# Patient Record
Sex: Male | Born: 1990 | Race: Black or African American | Hispanic: No | Marital: Single | Smoking: Never smoker
Health system: Southern US, Community
[De-identification: ages and names within clinical notes are randomized; demographics above are authoritative.]

---
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
## 2015-12-28 ENCOUNTER — Emergency Department (HOSPITAL_COMMUNITY)
Admission: EM | Admit: 2015-12-28 | Discharge: 2015-12-28 | Disposition: A | Discharge status: Home / Self Care | Payer: No Typology Code available for payment source | Attending: Emergency Medicine | Admitting: Emergency Medicine

## 2015-12-28 ENCOUNTER — Encounter (HOSPITAL_COMMUNITY): Payer: Self-pay

## 2015-12-28 DIAGNOSIS — Y9289 Other specified places as the place of occurrence of the external cause: Secondary | ICD-10-CM | POA: Diagnosis not present

## 2015-12-28 DIAGNOSIS — Y9389 Activity, other specified: Secondary | ICD-10-CM | POA: Diagnosis not present

## 2015-12-28 DIAGNOSIS — S161XXA Strain of muscle, fascia and tendon at neck level, initial encounter: Secondary | ICD-10-CM | POA: Insufficient documentation

## 2015-12-28 DIAGNOSIS — Y999 Unspecified external cause status: Secondary | ICD-10-CM | POA: Diagnosis not present

## 2015-12-28 DIAGNOSIS — S199XXA Unspecified injury of neck, initial encounter: Secondary | ICD-10-CM | POA: Diagnosis present

## 2015-12-28 MED ORDER — METHOCARBAMOL 500 MG PO TABS: 500.0000 mg | ORAL_TABLET | Freq: Two times a day (BID) | ORAL | Status: DC

## 2015-12-28 MED ORDER — NAPROXEN 500 MG PO TABS: 500.0000 mg | ORAL_TABLET | Freq: Two times a day (BID) | ORAL | Status: DC
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
## 2016-01-10 ENCOUNTER — Encounter (HOSPITAL_COMMUNITY): Payer: Self-pay | Admitting: Emergency Medicine

## 2016-01-10 ENCOUNTER — Emergency Department (INDEPENDENT_AMBULATORY_CARE_PROVIDER_SITE_OTHER): Payer: Self-pay

## 2016-01-10 ENCOUNTER — Emergency Department (INDEPENDENT_AMBULATORY_CARE_PROVIDER_SITE_OTHER)
Admission: EM | Admit: 2016-01-10 | Discharge: 2016-01-10 | Disposition: A | Discharge status: Home / Self Care | Payer: Self-pay | Source: Home / Self Care | Attending: Family Medicine | Admitting: Family Medicine

## 2016-01-10 DIAGNOSIS — M545 Low back pain: Secondary | ICD-10-CM

## 2016-01-10 MED ORDER — CYCLOBENZAPRINE HCL 10 MG PO TABS: 10.0000 mg | ORAL_TABLET | Freq: Two times a day (BID) | ORAL | Status: DC | PRN
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
## 2016-01-10 NOTE — Discharge Instructions (Signed)
Back Pain, Adult °Back pain is very common in adults. The cause of back pain is rarely dangerous and the pain often gets better over time. The cause of your back pain may not be known. Some common causes of back pain include: °1. Strain of the muscles or ligaments supporting the spine. °2. Wear and tear (degeneration) of the spinal disks. °3. Arthritis. °4. Direct injury to the back. °For many people, back pain may return. Since back pain is rarely dangerous, most people can learn to manage this condition on their own. °HOME CARE INSTRUCTIONS °Watch your back pain for any changes. The following actions may help to lessen any discomfort you are feeling: °1. Remain active. It is stressful on your back to sit or stand in one place for long periods of time. Do not sit, drive, or stand in one place for more than 30 minutes at a time. Take short walks on even surfaces as soon as you are able. Try to increase the length of time you walk each day. °2. Exercise regularly as directed by your health care provider. Exercise helps your back heal faster. It also helps avoid future injury by keeping your muscles strong and flexible. °3. Do not stay in bed. Resting more than 1-2 days can delay your recovery. °4. Pay attention to your body when you bend and lift. The most comfortable positions are those that put less stress on your recovering back. Always use proper lifting techniques, including: °1. Bending your knees. °2. Keeping the load close to your body. °3. Avoiding twisting. °5. Find a comfortable position to sleep. Use a firm mattress and lie on your side with your knees slightly bent. If you lie on your back, put a pillow under your knees. °6. Avoid feeling anxious or stressed. Stress increases muscle tension and can worsen back pain. It is important to recognize when you are anxious or stressed and learn ways to manage it, such as with exercise. °7. Take medicines only as directed by your health care provider.  Over-the-counter medicines to reduce pain and inflammation are often the most helpful. Your health care provider may prescribe muscle relaxant drugs. These medicines help dull your pain so you can more quickly return to your normal activities and healthy exercise. °8. Apply ice to the injured area: °1. Put ice in a plastic bag. °2. Place a towel between your skin and the bag. °3. Leave the ice on for 20 minutes, 2-3 times a day for the first 2-3 days. After that, ice and heat may be alternated to reduce pain and spasms. °9. Maintain a healthy weight. Excess weight puts extra stress on your back and makes it difficult to maintain good posture. °SEEK MEDICAL CARE IF: °1. You have pain that is not relieved with rest or medicine. °2. You have increasing pain going down into the legs or buttocks. °3. You have pain that does not improve in one week. °4. You have night pain. °5. You lose weight. °6. You have a fever or chills. °SEEK IMMEDIATE MEDICAL CARE IF:  °1. You develop new bowel or bladder control problems. °2. You have unusual weakness or numbness in your arms or legs. °3. You develop nausea or vomiting. °4. You develop abdominal pain. °5. You feel faint. °  °This information is not intended to replace advice given to you by your health care provider. Make sure you discuss any questions you have with your health care provider. °  °Document Released: 09/30/2005 Document Revised: 10/21/2014 Document Reviewed: 02/01/2014 °Elsevier Interactive Patient Education ©2016 [employer] ° °Back Exercises °If you have pain in your back, do these exercises 2-3 times each day or as told by your doctor. When the pain goes away, do the exercises once each day, but repeat the steps more times for each exercise (do more repetitions). If you do not have pain in your back, do these exercises once each day or as told by your doctor. °EXERCISES °Single Knee to Chest °Do these steps 3-5 times in a row for each leg: °5. Lie on your back  on a firm bed or the floor with your legs stretched out. °6. Bring one knee to your chest. °7. Hold your knee to your chest by grabbing your knee or thigh. °8. Pull on your knee until you feel a gentle stretch in your lower back. °9. Keep doing the stretch for 10-30 seconds. °10. Slowly let go of your leg and straighten it. °Pelvic Tilt °Do these steps 5-10 times in a row: °10. Lie on your back on a firm bed or the floor with your legs stretched out. °11. Bend your knees so they point up to the ceiling. Your feet should be flat on the floor. °12. Tighten your lower belly (abdomen) muscles to press your lower back against the floor. This will make your tailbone point up to the ceiling instead of pointing down to your feet or the floor. °13. Stay in this position for 5-10 seconds while you gently tighten your muscles and breathe evenly. °Cat-Cow °Do these steps until your lower back bends more easily: °7. Get on your hands and knees on a firm surface. Keep your hands under your shoulders, and keep your knees under your hips. You may put padding under your knees. °8. Let your head hang down, and make your tailbone point down to the floor so your lower back is round like the back of a cat. °9. Stay in this position for 5 seconds. °10. Slowly lift your head and make your tailbone point up to the ceiling so your back hangs low (sags) like the back of a cow. °11. Stay in this position for 5 seconds. °Press-Ups °Do these steps 5-10 times in a row: °6. Lie on your belly (face-down) on the floor. °7. Place your hands near your head, about shoulder-width apart. °8. While you keep your back relaxed and keep your hips on the floor, slowly straighten your arms to raise the top half of your body and lift your shoulders. Do not use your back muscles. To make yourself more comfortable, you may change where you place your hands. °9. Stay in this position for 5 seconds. °10. Slowly return to lying flat on the floor. °Bridges °Do these  steps 10 times in a row: °1. Lie on your back on a firm surface. °2. Bend your knees so they point up to the ceiling. Your feet should be flat on the floor. °3. Tighten your butt muscles and lift your butt off of the floor until your waist is almost as high as your knees. If you do not feel the muscles working in your butt and the back of your thighs, slide your feet 1-2 inches farther away from your butt. °4. Stay in this position for 3-5 seconds. °5. Slowly lower your butt to the floor, and let your butt muscles relax. °If this exercise is too easy, try doing it with your arms crossed over your chest. °Belly Crunches °Do these steps 5-10 times in a row: °1. Lie on your back on   a firm bed or the floor with your legs stretched out. °2. Bend your knees so they point up to the ceiling. Your feet should be flat on the floor. °3. Cross your arms over your chest. °4. Tip your chin a little bit toward your chest but do not bend your neck. °5. Tighten your belly muscles and slowly raise your chest just enough to lift your shoulder blades a tiny bit off of the floor. °6. Slowly lower your chest and your head to the floor. °Back Lifts °Do these steps 5-10 times in a row: °1. Lie on your belly (face-down) with your arms at your sides, and rest your forehead on the floor. °2. Tighten the muscles in your legs and your butt. °3. Slowly lift your chest off of the floor while you keep your hips on the floor. Keep the back of your head in line with the curve in your back. Look at the floor while you do this. °4. Stay in this position for 3-5 seconds. °5. Slowly lower your chest and your face to the floor. °GET HELP IF: °· Your back pain gets a lot worse when you do an exercise. °· Your back pain does not lessen 2 hours after you exercise. °If you have any of these problems, stop doing the exercises. Do not do them again unless your doctor says it is okay. °GET HELP RIGHT AWAY IF: °· You have sudden, very bad back pain. If this  happens, stop doing the exercises. Do not do them again unless your doctor says it is okay. °  °This information is not intended to replace advice given to you by your health care provider. Make sure you discuss any questions you have with your health care provider. °  °Document Released: 11/02/2010 Document Revised: 06/21/2015 Document Reviewed: 11/24/2014 °Elsevier Interactive Patient Education ©2016 [employer] ° °
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
## 2017-09-16 ENCOUNTER — Ambulatory Visit: Payer: Self-pay | Admitting: Emergency Medicine

## 2020-01-20 ENCOUNTER — Emergency Department (HOSPITAL_COMMUNITY)
Admission: EM | Admit: 2020-01-20 | Discharge: 2020-01-21 | Disposition: A | Discharge status: Home / Self Care | Payer: Self-pay | Attending: Emergency Medicine | Admitting: Emergency Medicine

## 2020-01-20 DIAGNOSIS — S0592XA Unspecified injury of left eye and orbit, initial encounter: Secondary | ICD-10-CM | POA: Insufficient documentation

## 2020-01-20 DIAGNOSIS — Y999 Unspecified external cause status: Secondary | ICD-10-CM | POA: Insufficient documentation

## 2020-01-20 DIAGNOSIS — Z79899 Other long term (current) drug therapy: Secondary | ICD-10-CM | POA: Insufficient documentation

## 2020-01-20 DIAGNOSIS — Z791 Long term (current) use of non-steroidal anti-inflammatories (NSAID): Secondary | ICD-10-CM | POA: Insufficient documentation

## 2020-01-20 DIAGNOSIS — Y929 Unspecified place or not applicable: Secondary | ICD-10-CM | POA: Insufficient documentation

## 2020-01-20 DIAGNOSIS — Y9389 Activity, other specified: Secondary | ICD-10-CM | POA: Insufficient documentation

## 2020-01-20 DIAGNOSIS — X58XXXA Exposure to other specified factors, initial encounter: Secondary | ICD-10-CM | POA: Insufficient documentation

## 2020-01-20 DIAGNOSIS — Z23 Encounter for immunization: Secondary | ICD-10-CM | POA: Insufficient documentation

## 2020-01-21 ENCOUNTER — Other Ambulatory Visit: Payer: Self-pay

## 2020-01-21 MED ORDER — ERYTHROMYCIN 5 MG/GM OP OINT: TOPICAL_OINTMENT | OPHTHALMIC | 0 refills | Status: AC

## 2020-01-21 MED ORDER — FLUORESCEIN SODIUM 1 MG OP STRP
1.0000 | ORAL_STRIP | Freq: Once | OPHTHALMIC | Status: AC
  Administered 2020-01-21: 1 via OPHTHALMIC
  Filled 2020-01-21: qty 1

## 2020-01-21 MED ORDER — TETRACAINE HCL 0.5 % OP SOLN
2.0000 [drp] | Freq: Once | OPHTHALMIC | Status: AC
  Administered 2020-01-21: 09:00:00 2 [drp] via OPHTHALMIC
  Filled 2020-01-21: qty 4

## 2020-01-21 MED ORDER — TETANUS-DIPHTH-ACELL PERTUSSIS 5-2.5-18.5 LF-MCG/0.5 IM SUSP
0.5000 mL | Freq: Once | INTRAMUSCULAR | Status: AC
Start: 2020-01-21 — End: 2020-01-21
  Administered 2020-01-21: 09:00:00 0.5 mL via INTRAMUSCULAR
  Filled 2020-01-21: qty 0.5
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
## 2020-01-21 NOTE — Discharge Instructions (Signed)
You likely have a scratch of the eye on the cornea(the clear part of the eye. This condition may be caused by trauma.  Use antibiotic eye ointment as directed.    Follow-up with the referred ophthalmologist Dr. Sherrine Maples. Call their office today and arrange for a next available appointment.   Follow-up with your primary care doctor in 24-48 hours. Call their office and let them know you were seen in the ED. If you do not have a primary care doctor, use one listed in the paperwork for follow-up.   Return to the Emergency Department for any worsening pain, redness in the eyes, vision changes, facial swelling, fevers, or any other concerns.

## 2020-01-21 NOTE — ED Notes (Signed)
Visual acuity completed: 20 ft equivalent  Right eye 20/25 Left eye 20/25 Both eyes 20/25

## 2020-01-21 NOTE — ED Provider Notes (Signed)
[facility] EMERGENCY DEPARTMENT Provider Note   CSN: [id number] Arrival date & time: 01/20/20  2349     History Chief Complaint  Patient presents with  . Eye Injury    Shawn Fritz is a 29 y.o. male with no known past medical history presents to emergency department today with chief complaint of left eye injury x1 day.  Patient states he was moving wooden pallets when he suddenly felt like he had something in his left eye.  He describes a constant scratching and itching sensation.  He tried to wash his eye with water but was still having pain and the foreign body sensation.  He states he has aching pain when opening his eye, pain is 2/10 inseverity. He does not wear contacts or glasses.  He denies fever, chills, difficulty seeing, blurry vision.  Unknown last tetanus immunization.  History provided by patient with additional history obtained from chart review.     No past medical history on file.  There are no problems to display for this patient.   No past surgical history on file.     No family history on file.  Social History   Tobacco Use  . Smoking status: Never Smoker  Substance Use Topics  . Alcohol use: Not on file  . Drug use: Not on file    Home Medications Prior to Admission medications   Medication Sig Start Date End Date Taking? Authorizing Provider  cyclobenzaprine (FLEXERIL) 10 MG tablet Take 1 tablet (10 mg total) by mouth 2 (two) times daily as needed for muscle spasms. 01/10/16   Tharon Aquas, PA  erythromycin ophthalmic ointment Place a 1/2 inch ribbon of ointment into the left lower eyelid x 4 times daily 01/21/20 01/26/20  Acen Craun, Yvonna Alanis E, PA-C  methocarbamol (ROBAXIN) 500 MG tablet Take 1 tablet (500 mg total) by mouth 2 (two) times daily. 12/28/15   Santiago Glad, PA-C  naproxen (NAPROSYN) 500 MG tablet Take 1 tablet (500 mg total) by mouth 2 (two) times daily. 12/28/15   Santiago Glad, PA-C    Allergies     Patient has no known allergies.  Review of Systems   Review of Systems All other systems are reviewed and are negative for acute change except as noted in the HPI.  Physical Exam Updated Vital Signs BP 125/80 (BP Location: Left Arm)   Pulse 84   Temp (!) 97.4 F (36.3 C) (Oral)   Resp 18   SpO2 99%   Physical Exam Vitals and nursing note reviewed.  Constitutional:      Appearance: He is well-developed. He is not ill-appearing or toxic-appearing.  HENT:     Head: Normocephalic and atraumatic.     Nose: Nose normal.  Eyes:     General: Lids are normal. Lids are everted, no foreign bodies appreciated. Vision grossly intact. No scleral icterus.       Right eye: No discharge.        Left eye: No foreign body or discharge.     Extraocular Movements: Extraocular movements intact.     Conjunctiva/sclera:     Right eye: Right conjunctiva is not injected. No chemosis, exudate or hemorrhage.    Left eye: Left conjunctiva is injected. No chemosis, exudate or hemorrhage.    Pupils: Pupils are equal, round, and reactive to light.     Right eye: No fluorescein uptake.     Left eye: No fluorescein uptake.     Comments: Tetracaine and Fluorescein applied to  both eyes.  Evaluation by Joseph Art lamp revealed no evidence of corneal abrasion/fluorescein uptake.  No dendritic lesions.  Negative Seidel sign.   Visual acuity : 20 ft equivalent  Right eye 20/25 Left eye 20/25 Both eyes 20/25  Neck:     Vascular: No JVD.  Cardiovascular:     Rate and Rhythm: Normal rate and regular rhythm.     Pulses: Normal pulses.     Heart sounds: Normal heart sounds.  Pulmonary:     Effort: Pulmonary effort is normal.     Breath sounds: Normal breath sounds.  Abdominal:     General: There is no distension.  Musculoskeletal:        General: Normal range of motion.     Cervical back: Normal range of motion.  Skin:    General: Skin is warm and dry.  Neurological:     Mental Status: He is oriented to  person, place, and time.     GCS: GCS eye subscore is 4. GCS verbal subscore is 5. GCS motor subscore is 6.     Comments: Fluent speech, no facial droop.  Psychiatric:        Behavior: Behavior normal.     ED Results / Procedures / Treatments   Labs (all labs ordered are listed, but only abnormal results are displayed) Labs Reviewed - No data to display  EKG None  Radiology No results found.  Procedures Procedures (including critical care time)  Medications Ordered in ED Medications  tetracaine (PONTOCAINE) 0.5 % ophthalmic solution 2 drop (2 drops Left Eye Given by Other 01/21/20 0839)  fluorescein ophthalmic strip 1 strip (1 strip Left Eye Given 01/21/20 0839)  Tdap (BOOSTRIX) injection 0.5 mL (0.5 mLs Intramuscular Given 01/21/20 0849)    ED Course  I have reviewed the triage vital signs and the nursing notes.  Pertinent labs & imaging results that were available during my care of the patient were reviewed by me and considered in my medical decision making (see chart for details).    MDM Rules/Calculators/A&P                      Patient seen and examined. Patient presents awake, alert, hemodynamically stable, afebrile, non toxic.  Patient had prolonged wait time in the lobby due to high volume.  By the time he made it to exam room he states pain had resolved and he no longer has foreign body sensation.  His pupils are equal and reactive, normal in appearance.  He felt like he had a piece of wood in his eye earlier, will cover for possible corneal abrasion. Tdap given. Eye irrigated w NS, no evidence of FB.  No change in vision, acuity equal bilaterally.  Pt is not a contact lens wearer.  Exam non-concerning for orbital cellulitis, hyphema, corneal ulcers. Patient will be discharged home with erythromycin ointment.   Patient understands to follow up with ophthalmology, & to return to ER if new symptoms develop including change in vision, purulent drainage, or entrapment.     Portions of this note were generated with Scientist, clinical (histocompatibility and immunogenetics). Dictation errors may occur despite best attempts at proofreading.   Final Clinical Impression(s) / ED Diagnoses Final diagnoses:  Left eye injury, initial encounter    Rx / DC Orders ED Discharge Orders         Ordered    erythromycin ophthalmic ointment     01/21/20 0829  Flint Melter 01/21/20 5462    Lacretia Leigh, MD 01/22/20 2040

## 2022-01-28 ENCOUNTER — Ambulatory Visit (HOSPITAL_COMMUNITY)
Admission: EM | Admit: 2022-01-28 | Discharge: 2022-01-28 | Disposition: A | Discharge status: Home / Self Care | Payer: 59 | Attending: Nurse Practitioner | Admitting: Nurse Practitioner

## 2022-01-28 DIAGNOSIS — B9689 Other specified bacterial agents as the cause of diseases classified elsewhere: Secondary | ICD-10-CM

## 2022-01-28 DIAGNOSIS — H109 Unspecified conjunctivitis: Secondary | ICD-10-CM | POA: Diagnosis not present

## 2022-01-28 MED ORDER — ERYTHROMYCIN 5 MG/GM OP OINT: TOPICAL_OINTMENT | Freq: Four times a day (QID) | OPHTHALMIC | 0 refills | Status: DC

## 2022-01-28 NOTE — Discharge Instructions (Signed)
-   Please start erythromycin eye ointment 4 times daily for 7 days ?-You are considered contagious until you have been on this medicine for 24 hours ?-Please seek care if your symptoms persist despite this treatment ?

## 2022-01-28 NOTE — ED Triage Notes (Signed)
Pt would like work note. ?

## 2022-01-28 NOTE — ED Provider Notes (Signed)
?[facility] ? ? ? ?CSN: [id number] ?Arrival date & time: 01/28/22  1935 ? ? ?  ? ?History   ?Chief Complaint ?Chief Complaint  ?Patient presents with  ? Conjunctivitis  ? ? ?HPI ?Shawn Fritz is a 31 y.o. male.  ? ?Patient presents with mother.  Mother provides entire history as patient does not speak.  Mom reports patient does not talk.  Mother reports eye redness started this morning.  She reports his employer requested he be seen in urgent care because pinkeye has been going around at his work.  Patient denies any eye itching, sneezing, itchy nose, sore throat, cough, congestion.  Mom reports left eye is worse and was matted shut this morning.  There has been drainage from the eye throughout the day. ? ? ?No past medical history on file. ? ?There are no problems to display for this patient. ? ? ?No past surgical history on file. ? ? ? ? ?Home Medications   ? ?Prior to Admission medications   ?Medication Sig Start Date End Date Taking? Authorizing Provider  ?erythromycin ophthalmic ointment Place into both eyes 4 (four) times daily. Place a 1/2 inch ribbon of ointment into the lower eyelid. 01/28/22  Yes Valentino Nose, NP  ? ? ?Family History ?No family history on file. ? ?Social History ?Social History  ? ?Tobacco Use  ? Smoking status: Never  ? ? ? ?Allergies   ?Patient has no known allergies. ? ? ?Review of Systems ?Review of Systems ?Per HPI ? ?Physical Exam ?Triage Vital Signs ?ED Triage Vitals [01/28/22 2009]  ?Enc Vitals Group  ?   BP 134/90  ?   Pulse Rate 90  ?   Resp 16  ?   Temp 98.3 ?F (36.8 ?C)  ?   Temp src   ?   SpO2 100 %  ?   Weight   ?   Height   ?   Head Circumference   ?   Peak Flow   ?   Pain Score 0  ?   Pain Loc   ?   Pain Edu?   ?   Excl. in GC?   ? ?No data found. ? ?Updated Vital Signs ?BP 134/90 (BP Location: Left Arm)   Pulse 90   Temp 98.3 ?F (36.8 ?C)   Resp 16   SpO2 100%  ? ?Visual Acuity ?Right Eye Distance:   ?Left Eye Distance:   ?Bilateral  Distance:   ? ?Right Eye Near:   ?Left Eye Near:    ?Bilateral Near:    ? ?Physical Exam ?Vitals and nursing note reviewed.  ?Constitutional:   ?   General: He is not in acute distress. ?   Appearance: Normal appearance. He is not ill-appearing or toxic-appearing.  ?HENT:  ?   Head: Normocephalic and atraumatic.  ?   Right Ear: Tympanic membrane, ear canal and external ear normal.  ?   Left Ear: Tympanic membrane, ear canal and external ear normal.  ?   Nose: No congestion or rhinorrhea.  ?   Mouth/Throat:  ?   Mouth: Mucous membranes are moist.  ?   Pharynx: Oropharynx is clear. No oropharyngeal exudate or posterior oropharyngeal erythema.  ?Eyes:  ?   General: No scleral icterus.    ?   Right eye: Discharge present.     ?   Left eye: Discharge present. ?   Extraocular Movements: Extraocular movements intact.  ?   Conjunctiva/sclera:  ?  Right eye: Right conjunctiva is injected. Exudate present. No hemorrhage. ?   Left eye: Left conjunctiva is injected. Exudate present. No hemorrhage. ?   Pupils: Pupils are equal, round, and reactive to light.  ?Cardiovascular:  ?   Rate and Rhythm: Normal rate and regular rhythm.  ?Pulmonary:  ?   Effort: Pulmonary effort is normal. No respiratory distress.  ?   Breath sounds: Normal breath sounds. No wheezing, rhonchi or rales.  ?Musculoskeletal:  ?   Cervical back: Normal range of motion and neck supple.  ?Lymphadenopathy:  ?   Cervical: No cervical adenopathy.  ?Skin: ?   General: Skin is warm and dry.  ?   Coloration: Skin is not jaundiced or pale.  ?   Findings: No erythema or rash.  ?Neurological:  ?   Mental Status: He is alert and oriented to person, place, and time.  ?   Motor: No weakness.  ?Psychiatric:     ?   Mood and Affect: Mood normal.     ?   Behavior: Behavior normal.  ? ? ? ?UC Treatments / Results  ?Labs ?(all labs ordered are listed, but only abnormal results are displayed) ?Labs Reviewed - No data to display ? ?EKG ? ? ?Radiology ?No results  found. ? ?Procedures ?Procedures (including critical care time) ? ?Medications Ordered in UC ?Medications - No data to display ? ?Initial Impression / Assessment and Plan / UC Course  ?I have reviewed the triage vital signs and the nursing notes. ? ?Pertinent labs & imaging results that were available during my care of the patient were reviewed by me and considered in my medical decision making (see chart for details). ? ?  ?Treat bacterial conjunctivitis with erythromycin eye ointment 4 times daily for 7 days.  Stay out of work until he has been on this medicine for 24 hours-work note given.  Discussed hand hygiene and avoiding touching eyes/washing hands frequently.  Seek care if symptoms persist despite treatment. ?Final Clinical Impressions(s) / UC Diagnoses  ? ?Final diagnoses:  ?Bacterial conjunctivitis of both eyes  ? ? ? ?Discharge Instructions   ? ?  ?- Please start erythromycin eye ointment 4 times daily for 7 days ?-You are considered contagious until you have been on this medicine for 24 hours ?-Please seek care if your symptoms persist despite this treatment ? ? ? ?ED Prescriptions   ? ? Medication Sig Dispense Auth. Provider  ? erythromycin ophthalmic ointment Place into both eyes 4 (four) times daily. Place a 1/2 inch ribbon of ointment into the lower eyelid. 3.5 g Valentino Nose, NP  ? ?  ? ?PDMP not reviewed this encounter. ?  ?Valentino Nose, NP ?01/28/22 2039 ? ?

## 2022-01-28 NOTE — ED Triage Notes (Signed)
C/o red eyes x 1 day.  ?

## 2023-01-13 ENCOUNTER — Other Ambulatory Visit: Payer: Self-pay

## 2023-01-13 ENCOUNTER — Emergency Department (HOSPITAL_BASED_OUTPATIENT_CLINIC_OR_DEPARTMENT_OTHER)
Admission: EM | Admit: 2023-01-13 | Discharge: 2023-01-13 | Disposition: A | Discharge status: Home / Self Care | Payer: 59 | Attending: Emergency Medicine | Admitting: Emergency Medicine

## 2023-01-13 ENCOUNTER — Encounter (HOSPITAL_BASED_OUTPATIENT_CLINIC_OR_DEPARTMENT_OTHER): Payer: Self-pay | Admitting: Emergency Medicine

## 2023-01-13 DIAGNOSIS — R1084 Generalized abdominal pain: Secondary | ICD-10-CM

## 2023-01-13 LAB — COMPREHENSIVE METABOLIC PANEL
ALT: 12 U/L (ref 0–44)
AST: 18 U/L (ref 15–41)
Albumin: 4.6 g/dL (ref 3.5–5.0)
Alkaline Phosphatase: 41 U/L (ref 38–126)
Anion gap: 8 (ref 5–15)
BUN: 8 mg/dL (ref 6–20)
CO2: 29 mmol/L (ref 22–32)
Calcium: 9.8 mg/dL (ref 8.9–10.3)
Chloride: 101 mmol/L (ref 98–111)
Creatinine, Ser: 0.9 mg/dL (ref 0.61–1.24)
GFR, Estimated: >60 mL/min (ref >60)
Glucose, Bld: 83 mg/dL (ref 70–99)
Potassium: 4.1 mmol/L (ref 3.5–5.1)
Sodium: 138 mmol/L (ref 135–145)
Total Bilirubin: 0.9 mg/dL (ref 0.3–1.2)
Total Protein: 7.3 g/dL (ref 6.5–8.1)

## 2023-01-13 LAB — CBC
HCT: 48.4 % (ref 39.0–52.0)
Hemoglobin: 15.8 g/dL (ref 13.0–17.0)
MCH: 25.6 pg — ABNORMAL LOW (ref 26.0–34.0)
MCHC: 32.6 g/dL (ref 30.0–36.0)
MCV: 78.4 fL — ABNORMAL LOW (ref 80.0–100.0)
Platelets: 280 10*3/uL (ref 150–400)
RBC: 6.17 MIL/uL — ABNORMAL HIGH (ref 4.22–5.81)
RDW: 15.2 % (ref 11.5–15.5)
WBC: 7.4 10*3/uL (ref 4.0–10.5)
nRBC: 0 % (ref 0.0–0.2)

## 2023-01-13 LAB — URINALYSIS, ROUTINE W REFLEX MICROSCOPIC
Bacteria, UA: NONE SEEN
Bilirubin Urine: NEGATIVE
Glucose, UA: NEGATIVE mg/dL
Ketones, ur: NEGATIVE mg/dL
Leukocytes,Ua: NEGATIVE
Nitrite: NEGATIVE
Protein, ur: NEGATIVE mg/dL
Specific Gravity, Urine: 1.022 (ref 1.005–1.030)
pH: 6 (ref 5.0–8.0)

## 2023-01-13 LAB — LIPASE, BLOOD: Lipase: 10 U/L — ABNORMAL LOW (ref 11–51)

## 2023-01-13 MED ORDER — ONDANSETRON 8 MG PO TBDP: 8.0000 mg | ORAL_TABLET | Freq: Three times a day (TID) | ORAL | 0 refills | Status: AC | PRN

## 2023-01-13 NOTE — ED Triage Notes (Signed)
Lower abdominal pain x "a few days" + n/v/d , fatigue

## 2023-01-13 NOTE — ED Notes (Signed)
Pt refused blood draws

## 2023-01-13 NOTE — ED Provider Notes (Signed)
[facility] EMERGENC[facility] Provider Note: Georgena Spurling, MD, FACEP  CSN: [id number] MRN: [id number] ARRIVAL: 01/13/23 at [place]: [id number]   CHIEF COMPLAINT  Abdominal Pain   HISTORY OF PRESENT ILLNESS  01/13/23 10:51 PM Shawn Fritz is a 32 y.o. male who had abdominal pain most of this morning.  He describes it as sharp and rated it as a 3 out of 10.  It is no longer present.  He had no associated nausea, vomiting or diarrhea.  He did have nausea, vomiting and diarrhea about 5 days ago.   History reviewed. No pertinent past medical history.  History reviewed. No pertinent surgical history.  History reviewed. No pertinent family history.  Social History   Tobacco Use   Smoking status: Never    Prior to Admission medications   Medication Sig Start Date End Date Taking? Authorizing Provider  ondansetron (ZOFRAN-ODT) 8 MG disintegrating tablet Take 1 tablet (8 mg total) by mouth every 8 (eight) hours as needed for nausea or vomiting. 01/13/23  Yes Maricus Tanzi, MD    Allergies Patient has no known allergies.   REVIEW OF SYSTEMS  Negative except as noted here or in the History of Present Illness.   PHYSICAL EXAMINATION  Initial Vital Signs Blood pressure 111/77, pulse 61, temperature 98.3 F (36.8 C), resp. rate 18, SpO2 99 %.  Examination General: Well-developed, well-nourished male in no acute distress; appearance consistent with age of record HENT: normocephalic; atraumatic Eyes: Normal appearance Neck: supple Heart: regular rate and rhythm Lungs: clear to auscultation bilaterally Abdomen: soft; nondistended; nontender; bowel sounds present Extremities: No deformity; full range of motion; pulses normal Neurologic: Awake, alert and oriented; motor function intact in all extremities and symmetric; no facial droop Skin: Warm and dry Psychiatric: Flat affect   RESULTS  Summary of this visit's results, reviewed and interpreted by myself:   EKG  Interpretation  Date/Time:    Ventricular Rate:    PR Interval:    QRS Duration:   QT Interval:    QTC Calculation:   R Axis:     Text Interpretation:         Laboratory Studies: Results for orders placed or performed during the hospital encounter of 01/13/23 (from the past 24 hour(s))  Urinalysis, Routine w reflex microscopic -Urine, Clean Catch     Status: Abnormal   Collection Time: 01/13/23  7:25 PM  Result Value Ref Range   Color, Urine YELLOW YELLOW   APPearance CLEAR CLEAR   Specific Gravity, Urine 1.022 1.005 - 1.030   pH 6.0 5.0 - 8.0   Glucose, UA NEGATIVE NEGATIVE mg/dL   Hgb urine dipstick SMALL (A) NEGATIVE   Bilirubin Urine NEGATIVE NEGATIVE   Ketones, ur NEGATIVE NEGATIVE mg/dL   Protein, ur NEGATIVE NEGATIVE mg/dL   Nitrite NEGATIVE NEGATIVE   Leukocytes,Ua NEGATIVE NEGATIVE   RBC / HPF 0-5 0 - 5 RBC/hpf   WBC, UA 0-5 0 - 5 WBC/hpf   Bacteria, UA NONE SEEN NONE SEEN   Squamous Epithelial / HPF 0-5 0 - 5 /HPF   Mucus PRESENT   Lipase, blood     Status: Abnormal   Collection Time: 01/13/23 10:31 PM  Result Value Ref Range   Lipase 10 (L) 11 - 51 U/L  Comprehensive metabolic panel     Status: None   Collection Time: 01/13/23 10:31 PM  Result Value Ref Range   Sodium 138 135 - 145 mmol/L   Potassium 4.1 3.5 - 5.1 mmol/L   Chloride  101 98 - 111 mmol/L   CO2 29 22 - 32 mmol/L   Glucose, Bld 83 70 - 99 mg/dL   BUN 8 6 - 20 mg/dL   Creatinine, Ser 0.90 0.61 - 1.24 mg/dL   Calcium 9.8 8.9 - 10.3 mg/dL   Total Protein 7.3 6.5 - 8.1 g/dL   Albumin 4.6 3.5 - 5.0 g/dL   AST 18 15 - 41 U/L   ALT 12 0 - 44 U/L   Alkaline Phosphatase 41 38 - 126 U/L   Total Bilirubin 0.9 0.3 - 1.2 mg/dL   GFR, Estimated >60 >60 mL/min   Anion gap 8 5 - 15  CBC     Status: Abnormal   Collection Time: 01/13/23 10:31 PM  Result Value Ref Range   WBC 7.4 4.0 - 10.5 K/uL   RBC 6.17 (H) 4.22 - 5.81 MIL/uL   Hemoglobin 15.8 13.0 - 17.0 g/dL   HCT 48.4 39.0 - 52.0 %   MCV 78.4  (L) 80.0 - 100.0 fL   MCH 25.6 (L) 26.0 - 34.0 pg   MCHC 32.6 30.0 - 36.0 g/dL   RDW 15.2 11.5 - 15.5 %   Platelets 280 150 - 400 K/uL   nRBC 0.0 0.0 - 0.2 %   Imaging Studies: No results found.  ED COURSE and MDM  Nursing notes, initial and subsequent vitals signs, including pulse oximetry, reviewed and interpreted by myself.  Vitals:   01/13/23 1916 01/13/23 2235 01/13/23 2245 01/13/23 2300  BP: 133/88 114/77 111/77 116/79  Pulse: 78 62 61 62  Resp: 18 18 18 16   Temp: 98.3 F (36.8 C)   98 F (36.7 C)  SpO2: 100% 100% 99% 100%   Medications - No data to display  11:24 PM Patient advised of reassuring laboratory studies.  He is not currently having any abdominal pain or tenderness.  He had no nausea, vomiting or diarrhea today.  He does not appear dehydrated based on his urinalysis.  PROCEDURES  Procedures   ED DIAGNOSES     ICD-10-CM   1. Generalized abdominal pain  R10.84          Shanon Rosser, MD 01/13/23 2326
